# Patient Record
Sex: Female | Born: 1975
Health system: Southern US, Community
[De-identification: ages and names within clinical notes are randomized; demographics above are authoritative.]

## PROBLEM LIST (undated history)

## (undated) DIAGNOSIS — I10 Essential (primary) hypertension: Secondary | ICD-10-CM

## (undated) DIAGNOSIS — B1920 Unspecified viral hepatitis C without hepatic coma: Secondary | ICD-10-CM

## (undated) HISTORY — PX: ABDOMINAL SURGERY: SHX537

## (undated) HISTORY — PX: APPENDECTOMY: SHX54

---

## 2002-08-17 ENCOUNTER — Encounter: Payer: Self-pay | Admitting: Family Medicine

## 2002-08-17 ENCOUNTER — Ambulatory Visit (HOSPITAL_COMMUNITY): Admission: RE | Admit: 2002-08-17 | Discharge: 2002-08-17 | Payer: Self-pay | Admitting: Family Medicine

## 2011-04-03 ENCOUNTER — Emergency Department (HOSPITAL_COMMUNITY): Payer: Self-pay

## 2011-04-03 ENCOUNTER — Emergency Department (HOSPITAL_COMMUNITY)
Admission: EM | Admit: 2011-04-03 | Discharge: 2011-04-03 | Disposition: A | Discharge status: Home / Self Care | Payer: Self-pay | Attending: Emergency Medicine | Admitting: Emergency Medicine

## 2011-04-03 ENCOUNTER — Other Ambulatory Visit: Payer: Self-pay

## 2011-04-03 ENCOUNTER — Encounter: Payer: Self-pay | Admitting: *Deleted

## 2011-04-03 DIAGNOSIS — I10 Essential (primary) hypertension: Secondary | ICD-10-CM | POA: Insufficient documentation

## 2011-04-03 DIAGNOSIS — R0602 Shortness of breath: Secondary | ICD-10-CM | POA: Insufficient documentation

## 2011-04-03 DIAGNOSIS — F17200 Nicotine dependence, unspecified, uncomplicated: Secondary | ICD-10-CM | POA: Insufficient documentation

## 2011-04-03 DIAGNOSIS — K529 Noninfective gastroenteritis and colitis, unspecified: Secondary | ICD-10-CM

## 2011-04-03 DIAGNOSIS — R1011 Right upper quadrant pain: Secondary | ICD-10-CM | POA: Insufficient documentation

## 2011-04-03 DIAGNOSIS — G44209 Tension-type headache, unspecified, not intractable: Secondary | ICD-10-CM

## 2011-04-03 DIAGNOSIS — R42 Dizziness and giddiness: Secondary | ICD-10-CM | POA: Insufficient documentation

## 2011-04-03 DIAGNOSIS — B349 Viral infection, unspecified: Secondary | ICD-10-CM

## 2011-04-03 DIAGNOSIS — R51 Headache: Secondary | ICD-10-CM | POA: Insufficient documentation

## 2011-04-03 DIAGNOSIS — N390 Urinary tract infection, site not specified: Secondary | ICD-10-CM

## 2011-04-03 DIAGNOSIS — R002 Palpitations: Secondary | ICD-10-CM | POA: Insufficient documentation

## 2011-04-03 DIAGNOSIS — E860 Dehydration: Secondary | ICD-10-CM

## 2011-04-03 HISTORY — DX: Essential (primary) hypertension: I10

## 2011-04-03 LAB — COMPREHENSIVE METABOLIC PANEL
ALT: 11 U/L (ref 0–35)
Alkaline Phosphatase: 62 U/L (ref 39–117)
BUN: 10 mg/dL (ref 6–23)
CO2: 27 mEq/L (ref 19–32)
GFR calc Af Amer: >60 mL/min (ref >60)
GFR calc non Af Amer: >60 mL/min (ref >60)
Glucose, Bld: 94 mg/dL (ref 70–99)
Potassium: 4.1 mEq/L (ref 3.5–5.1)
Sodium: 135 mEq/L (ref 135–145)
Total Bilirubin: 0.7 mg/dL (ref 0.3–1.2)
Total Protein: 7.6 g/dL (ref 6.0–8.3)

## 2011-04-03 LAB — CBC
Hemoglobin: 16.4 g/dL — ABNORMAL HIGH (ref 12.0–15.0)
MCH: 31.8 pg (ref 26.0–34.0)
MCV: 93 fL (ref 78.0–100.0)
RBC: 5.15 MIL/uL — ABNORMAL HIGH (ref 3.87–5.11)
WBC: 7.8 10*3/uL (ref 4.0–10.5)

## 2011-04-03 LAB — DIFFERENTIAL
Eosinophils Absolute: 0.1 10*3/uL (ref 0.0–0.7)
Lymphocytes Relative: 26 % (ref 12–46)
Lymphs Abs: 2 10*3/uL (ref 0.7–4.0)
Monocytes Relative: 9 % (ref 3–12)
Neutrophils Relative %: 64 % (ref 43–77)

## 2011-04-03 LAB — URINALYSIS, ROUTINE W REFLEX MICROSCOPIC
Bilirubin Urine: NEGATIVE
Glucose, UA: NEGATIVE mg/dL
Hgb urine dipstick: NEGATIVE
Ketones, ur: 15 mg/dL — AB
Protein, ur: NEGATIVE mg/dL
Urobilinogen, UA: 0.2 mg/dL (ref 0.0–1.0)

## 2011-04-03 LAB — URINE MICROSCOPIC-ADD ON

## 2011-04-03 LAB — LIPASE, BLOOD: Lipase: 29 U/L (ref 11–59)

## 2011-04-03 MED ORDER — KETOROLAC TROMETHAMINE 30 MG/ML IJ SOLN
30.0000 mg | Freq: Once | INTRAMUSCULAR | Status: DC
  Filled 2011-04-03: qty 1

## 2011-04-03 MED ORDER — SODIUM CHLORIDE 0.9 % IV SOLN
INTRAVENOUS | Status: DC
  Administered 2011-04-03: 17:00:00 via INTRAVENOUS

## 2011-04-03 MED ORDER — FAMOTIDINE 20 MG PO TABS: 20.0000 mg | ORAL_TABLET | Freq: Two times a day (BID) | ORAL | Status: DC

## 2011-04-03 MED ORDER — HYDROCODONE-ACETAMINOPHEN 5-325 MG PO TABS: 2.0000 | ORAL_TABLET | ORAL | Status: AC | PRN

## 2011-04-03 MED ORDER — ACETAMINOPHEN 325 MG PO TABS
650.0000 mg | ORAL_TABLET | Freq: Once | ORAL | Status: AC
  Administered 2011-04-03: 650 mg via ORAL
  Filled 2011-04-03: qty 2

## 2011-04-03 MED ORDER — SULFAMETHOXAZOLE-TRIMETHOPRIM 800-160 MG PO TABS: 1.0000 | ORAL_TABLET | Freq: Two times a day (BID) | ORAL | Status: AC

## 2011-04-03 MED ORDER — ONDANSETRON HCL 4 MG/2ML IJ SOLN
4.0000 mg | Freq: Once | INTRAMUSCULAR | Status: AC
  Administered 2011-04-03: 4 mg via INTRAVENOUS
  Filled 2011-04-03: qty 2

## 2011-04-03 MED ORDER — FAMOTIDINE IN NACL 20-0.9 MG/50ML-% IV SOLN
20.0000 mg | Freq: Once | INTRAVENOUS | Status: AC
  Administered 2011-04-03: 20 mg via INTRAVENOUS
  Filled 2011-04-03: qty 50

## 2011-04-03 MED ORDER — HYDROMORPHONE HCL 1 MG/ML IJ SOLN
1.0000 mg | Freq: Once | INTRAMUSCULAR | Status: AC
  Administered 2011-04-03: 1 mg via INTRAVENOUS
  Filled 2011-04-03: qty 1

## 2011-04-03 MED ORDER — SODIUM CHLORIDE 0.9 % IV BOLUS (SEPSIS)
1000.0000 mL | Freq: Once | INTRAVENOUS | Status: AC
  Administered 2011-04-03 (×2): 1000 mL via INTRAVENOUS

## 2011-04-03 MED ORDER — ONDANSETRON HCL 4 MG PO TABS: 8.0000 mg | ORAL_TABLET | Freq: Three times a day (TID) | ORAL | Status: AC | PRN

## 2011-04-03 NOTE — ED Provider Notes (Signed)
History   Scribed for Kylie Bonier, MD, the patient was seen in room APA17/APA17. This chart was scribed by Kylie Reynolds. This patient's care was started at 3:27PM.   CSN: [id number] Arrival date & time: 04/03/2011  2:39 PM  Chief Complaint  Patient presents with     . Dizziness    HPI   HPI Kylie Reynolds is a 35 y.o. female who presents to the Emergency Department complaining of dizziness onset several days ago and persistent since with associated lightheadedness, palpitations, RUQ abdominal discomfort, severe HA, episodes of SOB and dark urine.  Patient reports dizziness is aggravated with movement of head, standing and sitting up and moderately relieved with lying flat. Patient states nausea and vomiting have become worse today noting that she is now unable to eat bland foods or drink water without vomiting. Furthermore, patient reports that she can "hear and feel a pulse in my ear" and describes the sound as a "wooshing". Denies diarrhea, dysuria, hematochezia. Reports last BM occurred this morning but had not had a BM for several days prior to today.    HPI ELEMENTS: Onset: Several days ago Duration: persistent since but  Modifying factors:   aggravated with movement of head, standing and sitting up and moderately relieved with lying flat Context:  as above  Associated symptoms:  lightheadedness, palpitations, RUQ abdominal discomfort, severe HA, episodes of SOB and dark urine. Denies diarrhea, dysuria, hematochezia.   PAST MEDICAL HISTORY:  Past Medical History  Diagnosis Date  . Hypertension     PAST SURGICAL HISTORY:  Past Surgical History  Procedure Date  . Abdominal surgery   . Appendectomy     FAMILY HISTORY:  History reviewed. No pertinent family history.   SOCIAL HISTORY: History   Social History  . Marital Status: Single    Spouse Name: N/A    Number of Children: N/A  . Years of Education: N/A   Social History Main Topics  . Smoking status:  Current Everyday Smoker -- 1.0 packs/day  . Smokeless tobacco: None  . Alcohol Use: No  . Drug Use: No  . Sexually Active:    Other Topics Concern  . None   Social History Narrative  . None      Review of Systems  Review of Systems 10 Systems reviewed and are negative for acute change except as noted in the HPI.  Allergies  Review of patient's allergies indicates no known allergies.  Home Medications   Current Outpatient Rx  Name Route Sig Dispense Refill  . IBUPROFEN 200 MG PO TABS Oral Take 400 mg by mouth every 6 (six) hours as needed. Headache     . LISINOPRIL-HYDROCHLOROTHIAZIDE 20-25 MG PO TABS Oral Take 1 tablet by mouth daily.        Physical Exam    BP 134/86  Pulse 95  Temp(Src) 97.7 F (36.5 C) (Oral)  Resp 20  Ht 5\' 8"  (1.727 m)  Wt 179 lb (81.194 kg)  BMI 27.22 kg/m2  SpO2 100%  LMP 03/20/2011  Physical Exam  Nursing note and vitals reviewed. Constitutional: She is oriented to person, place, and time. She appears well-developed and well-nourished.  HENT:  Head: Normocephalic and atraumatic.  Right Ear: Tympanic membrane normal.  Left Ear: Tympanic membrane normal.  Mouth/Throat: Uvula is midline, oropharynx is clear and moist and mucous membranes are normal.  Eyes: Conjunctivae and EOM are normal. Pupils are equal, round, and reactive to light.       Fundoscopic exam  normal.   Neck: Neck supple.  Cardiovascular: Normal rate, regular rhythm, S1 normal, S2 normal and normal heart sounds.  Exam reveals no gallop and no friction rub.   No murmur heard. Pulmonary/Chest: Effort normal and breath sounds normal. She has no wheezes. She has no rales.       Discomfort at RUQ and Epigastrium.   Abdominal: Soft. Bowel sounds are normal. She exhibits no distension. There is no tenderness.  Musculoskeletal: Normal range of motion. She exhibits no edema.  Neurological: She is alert and oriented to person, place, and time. No sensory deficit.       Dicks  hallpike maneuver causes mild nystagmus.   Skin: Skin is warm and dry.  Psychiatric: She has a normal mood and affect. Her behavior is normal.    ED Course  Procedures  OTHER DATA REVIEWED: Nursing notes, vital signs, and past medical records reviewed. Lab results reviewed and considered Imaging results reviewed and considered  DIAGNOSTIC STUDIES: Oxygen Saturation is 100% on room air, normal by my interpretation.     Date: 04/03/2011  Rate: 83 BPM  Rhythm: normal sinus rhythm  QRS Axis: normal  Intervals: normal  ST/T Wave abnormalities: normal  Conduction Disutrbances:none  Narrative Interpretation:   Old EKG Reviewed: unchanged   LABS / RADIOLOGY: Results for orders placed during the hospital encounter of 04/03/11  CBC      Component Value Range   WBC 7.8  4.0 - 10.5 (K/uL)   RBC 5.15 (*) 3.87 - 5.11 (MIL/uL)   Hemoglobin 16.4 (*) 12.0 - 15.0 (g/dL)   HCT 16.1 (*) 09.6 - 46.0 (%)   MCV 93.0  78.0 - 100.0 (fL)   MCH 31.8  26.0 - 34.0 (pg)   MCHC 34.2  30.0 - 36.0 (g/dL)   RDW 04.5  40.9 - 81.1 (%)   Platelets 204  150 - 400 (K/uL)  DIFFERENTIAL      Component Value Range   Neutrophils Relative 64  43 - 77 (%)   Neutro Abs 5.0  1.7 - 7.7 (K/uL)   Lymphocytes Relative 26  12 - 46 (%)   Lymphs Abs 2.0  0.7 - 4.0 (K/uL)   Monocytes Relative 9  3 - 12 (%)   Monocytes Absolute 0.7  0.1 - 1.0 (K/uL)   Eosinophils Relative 1  0 - 5 (%)   Eosinophils Absolute 0.1  0.0 - 0.7 (K/uL)   Basophils Relative 0  0 - 1 (%)   Basophils Absolute 0.0  0.0 - 0.1 (K/uL)  COMPREHENSIVE METABOLIC PANEL      Component Value Range   Sodium 135  135 - 145 (mEq/L)   Potassium 4.1  3.5 - 5.1 (mEq/L)   Chloride 96  96 - 112 (mEq/L)   CO2 27  19 - 32 (mEq/L)   Glucose, Bld 94  70 - 99 (mg/dL)   BUN 10  6 - 23 (mg/dL)   Creatinine, Ser 9.14  0.50 - 1.10 (mg/dL)   Calcium 78.2  8.4 - 10.5 (mg/dL)   Total Protein 7.6  6.0 - 8.3 (g/dL)   Albumin 4.2  3.5 - 5.2 (g/dL)   AST 15  0 - 37  (U/L)   ALT 11  0 - 35 (U/L)   Alkaline Phosphatase 62  39 - 117 (U/L)   Total Bilirubin 0.7  0.3 - 1.2 (mg/dL)   GFR calc non Af Amer >60  >60 (mL/min)   GFR calc Af Amer >60  >60 (mL/min)  LIPASE, BLOOD      Component Value Range   Lipase 29  11 - 59 (U/L)  TROPONIN I      Component Value Range   Troponin I <0.30  <0.30 (ng/mL)  URINALYSIS, ROUTINE W REFLEX MICROSCOPIC      Component Value Range   Color, Urine YELLOW  YELLOW    Appearance HAZY (*) CLEAR    Specific Gravity, Urine 1.020  1.005 - 1.030    pH 7.0  5.0 - 8.0    Glucose, UA NEGATIVE  NEGATIVE (mg/dL)   Hgb urine dipstick NEGATIVE  NEGATIVE    Bilirubin Urine NEGATIVE  NEGATIVE    Ketones, ur 15 (*) NEGATIVE (mg/dL)   Protein, ur NEGATIVE  NEGATIVE (mg/dL)   Urobilinogen, UA 0.2  0.0 - 1.0 (mg/dL)   Nitrite NEGATIVE  NEGATIVE    Leukocytes, UA MODERATE (*) NEGATIVE   URINE MICROSCOPIC-ADD ON      Component Value Range   Squamous Epithelial / LPF FEW (*) RARE    WBC, UA 7-10  <3 (WBC/hpf)   RBC / HPF 3-6  <3 (RBC/hpf)   Bacteria, UA FEW (*) RARE    Urine-Other MUCOUS PRESENT     Ct Head Wo Contrast  04/03/2011  *RADIOLOGY REPORT*  Clinical Data: Left sided headache.  CT HEAD WITHOUT CONTRAST  Technique:  Contiguous axial images were obtained from the base of the skull through the vertex without contrast.  Comparison: None.  Findings: No acute intracranial abnormality.  Specifically, no hemorrhage, hydrocephalus, mass lesion, acute infarction, or significant intracranial injury.  No acute calvarial abnormality. Visualized paranasal sinuses and mastoids clear.  Orbital soft tissues unremarkable.  IMPRESSION: No acute intracranial abnormality.  Original Report Authenticated By: Cyndie Chime, M.D.   US Abdomen Complete  04/03/2011  *RADIOLOGY REPORT*  Clinical Data:  Right upper quadrant pain  ABDOMINAL ULTRASOUND COMPLETE  Comparison:  None.  Findings:  Gallbladder:  Increased gallbladder wall thickness measuring 3.2  mm.  No stones or sludge.  Negative sonographic Murphy's sign.  Common Bile Duct:  Mild increased caliber of the common bile duct which measures 7.6 mm.  Liver: No focal mass lesion identified.  Within normal limits in parenchymal echogenicity.  IVC:  Appears normal.  Pancreas:  No abnormality identified.  Spleen:  Within normal limits in size and echotexture.  Right kidney:  Normal in size and parenchymal echogenicity.  No evidence of mass or hydronephrosis.  Left kidney:  Normal in size and parenchymal echogenicity.  No evidence of mass or hydronephrosis.  Abdominal Aorta:  No aneurysm identified.  IMPRESSION:  1.  Gallbladder wall is mildly thickened measuring 3.2 mm.  No additional secondary signs of acute cholecystitis identified.  Original Report Authenticated By: Rosealee Albee, M.D.    PROCEDURES:  ED COURSE / COORDINATION OF CARE: Orders Placed This Encounter  Procedures  . Urine culture  . US Abdomen Complete  . CT Head Wo Contrast  . CBC  . Differential  . Comprehensive metabolic panel  . Lipase, blood  . Troponin I  . Urinalysis with microscopic  . Urine microscopic-add on  . Orthostatic vital signs  . EKG test  4:40PM- Received phone call from Augusto Gamble in radiology regarding the CTA- Head that I ordered looking for aneurysm. He said that a CTA would give less diagnostic ield regarding a a ruptured aneurysm which I do not suspect based on history of present illness. The patient's Gaylyn Rong was gradual in onset, persistent with mild  to moderate intensity. He argued that a CTA-Head based on presentation was not protocol and that CT-Head without contrast was advised for initial workup. I do at this time agree with his recommendation and have changed the order to a CT-Head without contrast.   I have reviewed the patient's CT scan which shows mild gallbladder wall thickening, however there was no sonographic or clinical Murphy's sign on my reevaluation. The patient is now pain-free and  reports no nausea, states she is hungry and wishes to go home so that she can E. My impression is that this is a viral syndrome and not cholecystitis. I have reviewed the findings with the patient and she states her understanding of and agreement with the plan of discharge home for symptomatic treatment. MDM: Differential Diagnosis: Viral syndrome, gastroenteritis, benign peripheral vertigo, cerebral aneurysm, tension HA, orthostatic hypotension, cholecystitis, cholelithiasis, arrhythmia, anemia, electrolyte imbalance.    PLAN:  The patient is to return the emergency department if there is any worsening of symptoms. I have reviewed the discharge instructions with the patient/family  CONDITION ON DISCHARGE:   DIAGNOSIS: No diagnosis found.   MEDICATIONS GIVEN IN THE E.D.  Medications  lisinopril-hydrochlorothiazide (PRINZIDE,ZESTORETIC) 20-25 MG per tablet (not administered)  ibuprofen (ADVIL,MOTRIN) 200 MG tablet (not administered)  0.9 %  sodium chloride infusion (  Intravenous New Bag 04/03/11 1640)  famotidine (PEPCID) IVPB 20 mg (20 mg Intravenous Given 04/03/11 1819)  ketorolac (TORADOL) 30 MG/ML injection 30 mg (not administered)  sodium chloride 0.9 % bolus 1,000 mL (1000 mL Intravenous Given 04/03/11 1717)  acetaminophen (TYLENOL) tablet 650 mg (650 mg Oral Given 04/03/11 1714)  ondansetron (ZOFRAN) injection 4 mg (4 mg Intravenous Given 04/03/11 1818)  HYDROmorphone (DILAUDID) injection 1 mg (1 mg Intravenous Given 04/03/11 1819)      I personally performed the services described in this documentation, which was scribed in my presence. The recorded information has been reviewed and considered.  Kylie Bonier, MD 04/03/11 2046

## 2011-04-03 NOTE — ED Notes (Signed)
Pt c/o vomiting weakness and dizziness. Pt states she has been having palpations and gets lightheaded when she stands.

## 2011-04-05 LAB — URINE CULTURE
Colony Count: 8000
Culture  Setup Time: 201209220136

## 2012-08-23 ENCOUNTER — Emergency Department (HOSPITAL_COMMUNITY)
Admission: EM | Admit: 2012-08-23 | Discharge: 2012-08-24 | Disposition: A | Discharge status: Home / Self Care | Payer: Self-pay | Attending: Emergency Medicine | Admitting: Emergency Medicine

## 2012-08-23 ENCOUNTER — Emergency Department (HOSPITAL_COMMUNITY): Payer: Self-pay

## 2012-08-23 ENCOUNTER — Encounter (HOSPITAL_COMMUNITY): Payer: Self-pay | Admitting: Physical Medicine and Rehabilitation

## 2012-08-23 DIAGNOSIS — I10 Essential (primary) hypertension: Secondary | ICD-10-CM | POA: Insufficient documentation

## 2012-08-23 DIAGNOSIS — R110 Nausea: Secondary | ICD-10-CM | POA: Insufficient documentation

## 2012-08-23 DIAGNOSIS — F17200 Nicotine dependence, unspecified, uncomplicated: Secondary | ICD-10-CM | POA: Insufficient documentation

## 2012-08-23 DIAGNOSIS — K759 Inflammatory liver disease, unspecified: Secondary | ICD-10-CM

## 2012-08-23 DIAGNOSIS — R350 Frequency of micturition: Secondary | ICD-10-CM | POA: Insufficient documentation

## 2012-08-23 DIAGNOSIS — F1910 Other psychoactive substance abuse, uncomplicated: Secondary | ICD-10-CM | POA: Insufficient documentation

## 2012-08-23 DIAGNOSIS — M549 Dorsalgia, unspecified: Secondary | ICD-10-CM | POA: Insufficient documentation

## 2012-08-23 LAB — CBC WITH DIFFERENTIAL/PLATELET
Basophils Relative: 1 % (ref 0–1)
Eosinophils Absolute: 0 10*3/uL (ref 0.0–0.7)
Hemoglobin: 15.5 g/dL — ABNORMAL HIGH (ref 12.0–15.0)
Lymphs Abs: 1.1 10*3/uL (ref 0.7–4.0)
MCH: 30.5 pg (ref 26.0–34.0)
Neutro Abs: 2.9 10*3/uL (ref 1.7–7.7)
Neutrophils Relative %: 62 % (ref 43–77)
Platelets: 282 10*3/uL (ref 150–400)
RBC: 5.08 MIL/uL (ref 3.87–5.11)

## 2012-08-23 LAB — RAPID URINE DRUG SCREEN, HOSP PERFORMED
Amphetamines: NOT DETECTED
Barbiturates: NOT DETECTED
Opiates: NOT DETECTED
Tetrahydrocannabinol: NOT DETECTED

## 2012-08-23 LAB — COMPREHENSIVE METABOLIC PANEL
ALT: 165 U/L — ABNORMAL HIGH (ref 0–35)
AST: 325 U/L — ABNORMAL HIGH (ref 0–37)
Alkaline Phosphatase: 134 U/L — ABNORMAL HIGH (ref 39–117)
CO2: 28 mEq/L (ref 19–32)
Calcium: 8.7 mg/dL (ref 8.4–10.5)
Potassium: 3.9 mEq/L (ref 3.5–5.1)
Sodium: 144 mEq/L (ref 135–145)
Total Protein: 7.2 g/dL (ref 6.0–8.3)

## 2012-08-23 LAB — ETHANOL: Alcohol, Ethyl (B): <11 mg/dL (ref 0–11)

## 2012-08-23 MED ORDER — IBUPROFEN 200 MG PO TABS
600.0000 mg | ORAL_TABLET | Freq: Three times a day (TID) | ORAL | Status: DC | PRN
  Administered 2012-08-23: 600 mg via ORAL
  Filled 2012-08-23: qty 3

## 2012-08-23 MED ORDER — LORAZEPAM 1 MG PO TABS
1.0000 mg | ORAL_TABLET | Freq: Three times a day (TID) | ORAL | Status: DC | PRN
  Administered 2012-08-24: 1 mg via ORAL
  Filled 2012-08-23: qty 1

## 2012-08-23 NOTE — BH Assessment (Signed)
Assessment Note   Patient is a 37 year old white female requesting detox from prescription pills.   Patient reports that she has been abusing pills for the past 2 years.  Patient has a BAL of <11 and her UDS were positive for cocaine.  Patient reports that her last use of cocaine and prescription medication was yesterday.  Patient reports that she lost her job due to substance abuse.  Patient reports that the amount of drugs and alcohol she uses daily varies.  Patient reports that she has been experiencing the following withdrawal symptoms to include sweating, chills, agitation, aggressiveness, cramps, aches, tingling.    Patient denies any prior psychiatric hospitalization or mental health treatment.  Patient denies any past substance abuse treatment.  Patient reports that due to a prior UTI infection last week [facility] would not allow her to come without medical clearance.   Patient denies SI/HI.  Patient denies psychosis.    Axis I: Substance Abuse Opiate Dependence  Axis II: Deferred Axis III:  Past Medical History  Diagnosis Date  . Hypertension    Axis IV: economic problems, housing problems, occupational problems, problems related to social environment and problems with access to health care services Axis V: 41-50 serious symptoms  Past Medical History:  Past Medical History  Diagnosis Date  . Hypertension     Past Surgical History  Procedure Laterality Date  . Abdominal surgery    . Appendectomy      Family History: No family history on file.  Social History:  reports that she has been smoking.  She does not have any smokeless tobacco history on file. She reports that she uses illicit drugs (Cocaine). She reports that she does not drink alcohol.  Additional Social History:     CIWA: CIWA-Ar BP: 123/77 mmHg Pulse Rate: 79 COWS:    Allergies: No Known Allergies  Home Medications:  (Not in a hospital admission)  OB/GYN Status:  Patient's last menstrual period was  08/09/2012.  General Assessment Data Location of Assessment: [facility] ED ACT Assessment: Yes Living Arrangements: Other relatives Can pt return to current living arrangement?: Yes Admission Status: Voluntary Is patient capable of signing voluntary admission?: Yes Transfer from: Acute Hospital Referral Source: Self/Family/Friend  Education Status Is patient currently in school?: No  Risk to self Suicidal Ideation: No Suicidal Intent: No Is patient at risk for suicide?: No Suicidal Plan?: No Access to Means: No What has been your use of drugs/alcohol within the last 12 months?: none Previous Attempts/Gestures: No How many times?: 0 Other Self Harm Risks: none  Triggers for Past Attempts: None known Intentional Self Injurious Behavior: None Family Suicide History: No Recent stressful life event(s): Other (Comment) Persecutory voices/beliefs?: No Depression: Yes Depression Symptoms: Insomnia;Tearfulness;Fatigue;Loss of interest in usual pleasures Substance abuse history and/or treatment for substance abuse?: Yes Suicide prevention information given to non-admitted patients: Not applicable  Risk to Others Homicidal Ideation: No Thoughts of Harm to Others: No Current Homicidal Intent: No Current Homicidal Plan: No Access to Homicidal Means: No Identified Victim: none History of harm to others?: No Assessment of Violence: None Noted Violent Behavior Description: none Does patient have access to weapons?: No Criminal Charges Pending?: No Does patient have a court date: No  Psychosis Hallucinations: None noted Delusions: None noted  Mental Status Report Appear/Hygiene: Disheveled Eye Contact: Fair Motor Activity: Freedom of movement Speech: Slow Level of Consciousness: Alert Mood: Depressed Affect: Depressed Anxiety Level: Moderate Thought Processes: Coherent Judgement: Unimpaired Orientation: Person;Place;Time;Situation Obsessive Compulsive Thoughts/Behaviors:  None  Cognitive Functioning Concentration: Decreased Memory: Recent Intact;Remote Intact IQ: Average Insight: Fair Impulse Control: Poor Appetite: Poor Weight Loss: 0 Weight Gain: 0 Sleep: Decreased Total Hours of Sleep: 3 Vegetative Symptoms: None  ADLScreening [facility] Assessment Services) Patient's cognitive ability adequate to safely complete daily activities?: Yes Patient able to express need for assistance with ADLs?: Yes Independently performs ADLs?: Yes (appropriate for developmental age)  Abuse/Neglect [facility]) Physical Abuse: Denies Verbal Abuse: Denies Sexual Abuse: Denies  Prior Inpatient Therapy Prior Inpatient Therapy: No Prior Therapy Dates: na Prior Therapy Facilty/Provider(s): na Reason for Treatment: na  Prior Outpatient Therapy Prior Outpatient Therapy: No Prior Therapy Dates: na Prior Therapy Facilty/Provider(s): na Reason for Treatment: na  ADL Screening (condition at time of admission) Patient's cognitive ability adequate to safely complete daily activities?: Yes Patient able to express need for assistance with ADLs?: Yes Independently performs ADLs?: Yes (appropriate for developmental age)       Abuse/Neglect Assessment (Assessment to be complete while patient is alone) Physical Abuse: Denies Verbal Abuse: Denies Sexual Abuse: Denies Values / Beliefs Cultural Requests During Hospitalization: None Spiritual Requests During Hospitalization: None        Additional Information 1:1 In Past 12 Months?: No CIRT Risk: No Elopement Risk: No Does patient have medical clearance?: Yes     Disposition: Pending [facility]  Disposition Disposition of Patient: Referred to Patient referred to: [facility]  On Site Evaluation by:   Reviewed with Physician:     Phillip Heal LaVerne 08/23/2012 11:25 PM

## 2012-08-23 NOTE — BH Assessment (Signed)
Assessment Note   Patient is a 37 year old white female requesting detox from prescription pills.   Patient reports that she has been abusing pills for the past 2 years.  Patient has a BAL of <11 and her UDS were positive for cocaine.  Patient reports that her last use of cocaine and prescription medication was yesterday.  Patient reports that she lost her job due to substance abuse.  Patient reports that the amount of drugs and alcohol she uses daily varies.  Patient reports that she has been experiencing the following withdrawal symptoms to include sweating, chills, agitation, aggressiveness, cramps, aches, tingling.    Patient denies any prior psychiatric hospitalization or mental health treatment.  Patient denies any past substance abuse treatment.  Patient reports that due to a prior UTI infection last week [facility] would not allow her to come without medical clearance.   Patient denies SI/HI.  Patient denies psychosis.    Axis I: Opiate Dependence Axis II: Deferred Axis III:  Past Medical History  Diagnosis Date  . Hypertension    Axis IV: economic problems, occupational problems, problems related to legal system/crime, problems related to social environment and problems with primary support group Axis V: 41-50 serious symptoms  Past Medical History:  Past Medical History  Diagnosis Date  . Hypertension     Past Surgical History  Procedure Laterality Date  . Abdominal surgery    . Appendectomy      Family History: No family history on file.  Social History:  reports that she has been smoking.  She does not have any smokeless tobacco history on file. She reports that she uses illicit drugs (Cocaine). She reports that she does not drink alcohol.  Additional Social History:     CIWA: CIWA-Ar BP: 123/77 mmHg Pulse Rate: 79 COWS:    Allergies: No Known Allergies  Home Medications:  (Not in a hospital admission)  OB/GYN Status:  Patient's last menstrual period was  08/09/2012.  General Assessment Data Location of Assessment: [facility] ED ACT Assessment: Yes Living Arrangements: Other relatives Can pt return to current living arrangement?: Yes Admission Status: Voluntary Is patient capable of signing voluntary admission?: Yes Transfer from: Acute Hospital Referral Source: Self/Family/Friend  Education Status Is patient currently in school?: No  Risk to self Suicidal Ideation: No Suicidal Intent: No Is patient at risk for suicide?: No Suicidal Plan?: No Access to Means: No What has been your use of drugs/alcohol within the last 12 months?: none Previous Attempts/Gestures: No How many times?: 0 Other Self Harm Risks: none  Triggers for Past Attempts: None known Intentional Self Injurious Behavior: None Family Suicide History: No Recent stressful life event(s): Other (Comment) Persecutory voices/beliefs?: No Depression: Yes Depression Symptoms: Insomnia;Tearfulness;Fatigue;Loss of interest in usual pleasures Substance abuse history and/or treatment for substance abuse?: Yes Suicide prevention information given to non-admitted patients: Not applicable  Risk to Others Homicidal Ideation: No Thoughts of Harm to Others: No Current Homicidal Intent: No Current Homicidal Plan: No Access to Homicidal Means: No Identified Victim: none History of harm to others?: No Assessment of Violence: None Noted Violent Behavior Description: none Does patient have access to weapons?: No Criminal Charges Pending?: No Does patient have a court date: No  Psychosis Hallucinations: None noted Delusions: None noted  Mental Status Report Appear/Hygiene: Disheveled Eye Contact: Fair Motor Activity: Freedom of movement Speech: Slow Level of Consciousness: Alert Mood: Depressed Affect: Depressed Anxiety Level: Moderate Thought Processes: Coherent Judgement: Unimpaired Orientation: Person;Place;Time;Situation Obsessive Compulsive Thoughts/Behaviors:  None  Cognitive Functioning Concentration: Decreased Memory: Recent Intact;Remote Intact IQ: Average Insight: Fair Impulse Control: Poor Appetite: Poor Weight Loss: 0 Weight Gain: 0 Sleep: Decreased Total Hours of Sleep: 3 Vegetative Symptoms: None  ADLScreening [facility] Assessment Services) Patient's cognitive ability adequate to safely complete daily activities?: Yes Patient able to express need for assistance with ADLs?: Yes Independently performs ADLs?: Yes (appropriate for developmental age)  Abuse/Neglect [facility]) Physical Abuse: Denies Verbal Abuse: Denies Sexual Abuse: Denies  Prior Inpatient Therapy Prior Inpatient Therapy: No Prior Therapy Dates: na Prior Therapy Facilty/Provider(s): na Reason for Treatment: na  Prior Outpatient Therapy Prior Outpatient Therapy: No Prior Therapy Dates: na Prior Therapy Facilty/Provider(s): na Reason for Treatment: na  ADL Screening (condition at time of admission) Patient's cognitive ability adequate to safely complete daily activities?: Yes Patient able to express need for assistance with ADLs?: Yes Independently performs ADLs?: Yes (appropriate for developmental age)       Abuse/Neglect Assessment (Assessment to be complete while patient is alone) Physical Abuse: Denies Verbal Abuse: Denies Sexual Abuse: Denies Values / Beliefs Cultural Requests During Hospitalization: None Spiritual Requests During Hospitalization: None        Additional Information 1:1 In Past 12 Months?: No CIRT Risk: No Elopement Risk: No Does patient have medical clearance?: Yes     Disposition: Pending [facility].  Disposition Disposition of Patient: Referred to Patient referred to: [facility]  On Site Evaluation by:   Reviewed with Physician:     Phillip Heal LaVerne 08/23/2012 11:21 PM

## 2012-08-23 NOTE — ED Provider Notes (Signed)
History  This chart was scribed for Felicie Morn, non-physician practitioner working with Gilda Crease,  by Bennett Scrape, ED Scribe. This patient was seen in room TR11C/TR11C and the patient's care was started at 5:24 PM.  CSN: [id number]  Arrival date & time 08/23/12  1644   First MD Initiated Contact with Patient 08/23/12 1724      Chief Complaint  Patient presents with  . Medical Clearance     The history is provided by the patient. No language interpreter was used.    Kylie Reynolds is a 37 y.o. female who presents to the Emergency Department for medical clearnace for antibiotics for PNA and UTI. She states that she was diagnosed with PNA through CXR and an UTI last week at [facility] but failed to pick up her prescriptions. She denies any repeat radiology or lab work since then. She states that she has a bed at [facility] for prescription pain medication, crack cocaine and heroine abuse but is unable to be accepted due to the untreated issues. She states that her last use of both was yesterday. She reports urinary frequency, back pain and nausea but denies tremors, emesis, hematuria, CP and abdominal pain currently. She denies HI and SI. She has a h/o HTN and is a current everyday smoker but denies alcohol use.  Past Medical History  Diagnosis Date  . Hypertension     Past Surgical History  Procedure Laterality Date  . Abdominal surgery    . Appendectomy      No family history on file.  History  Substance Use Topics  . Smoking status: Current Every Day Smoker -- 1.00 packs/day  . Smokeless tobacco: Not on file  . Alcohol Use: No    No OB history provided.  Review of Systems  Cardiovascular: Negative for chest pain.  Gastrointestinal: Positive for nausea. Negative for vomiting and abdominal pain.  Genitourinary: Positive for frequency. Negative for hematuria.  Musculoskeletal: Positive for back pain. Negative for gait problem.  Neurological:  Negative for tremors.  All other systems reviewed and are negative.    Allergies  Review of patient's allergies indicates no known allergies.  Home Medications  No current outpatient prescriptions on file.  Triage Vitals: BP 145/91  Pulse 108  Temp(Src) 97.9 F (36.6 C) (Oral)  Resp 18  SpO2 99%  Physical Exam  Nursing note and vitals reviewed. Constitutional: She is oriented to person, place, and time. She appears well-developed and well-nourished. No distress.  HENT:  Head: Normocephalic and atraumatic.  Eyes: Conjunctivae and EOM are normal.  Neck: Normal range of motion. Neck supple. No tracheal deviation present.  Cardiovascular: Normal rate, regular rhythm and intact distal pulses.   Pulmonary/Chest: Effort normal and breath sounds normal. No respiratory distress.  Abdominal: Soft. There is no tenderness.  Musculoskeletal: Normal range of motion.  Right paraspinal lumbar tenderness  Neurological: She is alert and oriented to person, place, and time.  Skin: Skin is warm and dry.  Psychiatric: She has a normal mood and affect. Her behavior is normal.    ED Course  Procedures (including critical care time)  DIAGNOSTIC STUDIES: Oxygen Saturation is 99% on room air, normal by my interpretation.    COORDINATION OF CARE: 6:12 PM-Discussed treatment plan which includes CXR, CBC panel, UA, drug screen and ethanol screen with pt at bedside and pt agreed to plan.   6:30 PM- Discussed case with ACT who agrees to evaluate the pt in the ED.  Labs Reviewed  URINE RAPID DRUG SCREEN (HOSP PERFORMED) - Abnormal; Notable for the following:    Cocaine POSITIVE (*)    All other components within normal limits  CBC WITH DIFFERENTIAL - Abnormal; Notable for the following:    Hemoglobin 15.5 (*)    All other components within normal limits  COMPREHENSIVE METABOLIC PANEL - Abnormal; Notable for the following:    Glucose, Bld 101 (*)    AST 325 (*)    ALT 165 (*)    Alkaline  Phosphatase 134 (*)    All other components within normal limits  ETHANOL  ACETAMINOPHEN LEVEL  URINALYSIS, ROUTINE W REFLEX MICROSCOPIC  HEPATITIS PANEL, ACUTE   No results found.   No diagnosis found.  Patient moved to pod C awaiting ACT team evaluation.  Holding orders initiated.  MDM     I personally performed the services described in this documentation, which was scribed in my presence. The recorded information has been reviewed and is accurate.       Jimmye Norman, NP 08/24/12 [phone]

## 2012-08-23 NOTE — ED Notes (Signed)
Sabino Snipes (sister) Cell [phone] requests to be notified if pt gets placement.

## 2012-08-23 NOTE — ED Notes (Signed)
Pt presents to department for evaluation of detox from pain medications, heroin and crack cocaine. Last use Monday afternoon. Denies SI/HI. States she was seen at [facility] for same, but was unable to be accepted to [facility] due to untreated UTI and pneumonia. Denies pain at the time. Pt is calm and cooperative.

## 2012-08-23 NOTE — ED Notes (Signed)
Pt transported to Xray; transporter given ticket to ride

## 2012-08-24 LAB — URINALYSIS, ROUTINE W REFLEX MICROSCOPIC
Glucose, UA: NEGATIVE mg/dL
Hgb urine dipstick: NEGATIVE
Specific Gravity, Urine: 1.009 (ref 1.005–1.030)
pH: 6 (ref 5.0–8.0)

## 2012-08-24 MED ORDER — CLONIDINE HCL 0.1 MG PO TABS
0.1000 mg | ORAL_TABLET | Freq: Once | ORAL | Status: AC
  Administered 2012-08-24: 0.1 mg via ORAL
  Filled 2012-08-24: qty 1

## 2012-08-24 NOTE — ED Notes (Signed)
Kylie Reynolds from [facility] called stating that she received the paperwork and that she was working on placement.

## 2012-08-24 NOTE — ED Notes (Signed)
Per Berna Spare, pt has been accepted by [facility].  They would like for her to be discharged from here about 0930 to then arrive at [facility] at 1000.

## 2012-08-24 NOTE — ED Notes (Signed)
Pt on the phone with Berna Spare from ACT team.

## 2012-08-24 NOTE — BH Assessment (Signed)
[facility] Assessment Progress Note   This clinician spoke to Pat at [facility] at 04:10.  She said that patient had been accepted to their facility but that she needed to arrive around 10:00 on 02/12.  Patient had told clinician that she could be transported by her sister.  Patient is to call sister about transportation.  Nurse Everardo Pacific is going to talk to patient about being accepted and contacting sister for transportation.  Dr. Dierdre Highman was notified of patient being accepted for detox to [facility].

## 2012-08-24 NOTE — ED Provider Notes (Signed)
Pt seen/examined She is in no distress, but does report feeling some anxiety and SOB EKG reviewed She is in no distress, lung sounds clear She has some paraspinal back pain that is reproducible but no signs of trauma (no midline tenderness, and no focal motor deficits in her extremities) She has no abdominal tenderness - her LFTs are noted but unlikely acute biliary pathology, will need evaluation for this in the future     Date: 08/24/2012  Rate: 84  Rhythm: normal sinus rhythm  QRS Axis: right  Intervals: normal  ST/T Wave abnormalities: normal  Conduction Disutrbances:none  Narrative Interpretation:   Old EKG Reviewed: none available at time of interpretation    Joya Gaskins, MD 08/24/12 [phone]

## 2012-08-24 NOTE — ED Notes (Signed)
Resting awaiting [facility] placement

## 2012-08-24 NOTE — ED Provider Notes (Signed)
Medical screening examination/treatment/procedure(s) were performed by non-physician practitioner and as supervising physician I was immediately available for consultation/collaboration.  Ilona Colley J. Jaydon Avina, MD 08/24/12 2300 

## 2012-08-24 NOTE — ED Notes (Signed)
Pt belongings returned to her. Faxed current mar to [facility]. Also faxed copy of ecg

## 2012-08-24 NOTE — ED Notes (Signed)
Informed pt of her acceptance at [facility].  Pt states that she will call her sister later in the morning.

## 2012-08-24 NOTE — ED Notes (Addendum)
Patient has been evaluated by dr Bebe Shaggy. Pt medicated for symptoms of withdrawal. Pt hyperventilating and states she doesn't feel well. Emotional support given. Pt encouraged that meds given will help. Pt states she has not slept but is so tired. Dr Bebe Shaggy did review pt labs and physically assess pt.

## 2012-08-24 NOTE — ED Notes (Addendum)
Called [facility]. [phone] to verify it is ok for pt to come a little early. Spoke to [employer]. Report given cathy

## 2012-08-26 LAB — HEPATITIS PANEL, ACUTE
HCV Ab: NEGATIVE
Hep A IgM: NEGATIVE
Hep B C IgM: NEGATIVE
Hepatitis B Surface Ag: NEGATIVE

## 2018-07-04 ENCOUNTER — Emergency Department (HOSPITAL_BASED_OUTPATIENT_CLINIC_OR_DEPARTMENT_OTHER)
Admission: EM | Admit: 2018-07-04 | Discharge: 2018-07-04 | Disposition: A | Discharge status: Home / Self Care | Payer: Managed Care, Other (non HMO) | Attending: Emergency Medicine | Admitting: Emergency Medicine

## 2018-07-04 ENCOUNTER — Encounter (HOSPITAL_BASED_OUTPATIENT_CLINIC_OR_DEPARTMENT_OTHER): Payer: Self-pay

## 2018-07-04 ENCOUNTER — Other Ambulatory Visit: Payer: Self-pay

## 2018-07-04 DIAGNOSIS — F17200 Nicotine dependence, unspecified, uncomplicated: Secondary | ICD-10-CM | POA: Diagnosis not present

## 2018-07-04 DIAGNOSIS — I10 Essential (primary) hypertension: Secondary | ICD-10-CM | POA: Diagnosis not present

## 2018-07-04 DIAGNOSIS — M545 Low back pain: Secondary | ICD-10-CM | POA: Diagnosis present

## 2018-07-04 DIAGNOSIS — R202 Paresthesia of skin: Secondary | ICD-10-CM | POA: Diagnosis not present

## 2018-07-04 DIAGNOSIS — G8929 Other chronic pain: Secondary | ICD-10-CM | POA: Diagnosis not present

## 2018-07-04 DIAGNOSIS — M5441 Lumbago with sciatica, right side: Secondary | ICD-10-CM | POA: Diagnosis not present

## 2018-07-04 HISTORY — DX: Unspecified viral hepatitis C without hepatic coma: B19.20

## 2018-07-04 MED ORDER — KETOROLAC TROMETHAMINE 30 MG/ML IJ SOLN
30.0000 mg | Freq: Once | INTRAMUSCULAR | Status: AC
  Administered 2018-07-04: 30 mg via INTRAMUSCULAR
  Filled 2018-07-04: qty 1

## 2018-07-04 MED ORDER — NAPROXEN 500 MG PO TABS: 500.0000 mg | ORAL_TABLET | Freq: Two times a day (BID) | ORAL | 0 refills | Status: AC

## 2018-07-04 MED ORDER — METHOCARBAMOL 500 MG PO TABS: 500.0000 mg | ORAL_TABLET | Freq: Two times a day (BID) | ORAL | 0 refills | Status: DC

## 2018-07-04 MED FILL — NAPROXEN 500 MG TABLET: 500 | 15 days supply | Qty: 30 | Fill #0

## 2018-07-04 MED FILL — METHOCARBAMOL 500 MG TABLET: 500 | 10 days supply | Qty: 20 | Fill #0

## 2018-07-04 NOTE — ED Provider Notes (Signed)
[facility] EMERGENCY DEPARTMENT Provider Note   CSN: [id number] Arrival date & time: 07/04/18  1438     History   Chief Complaint Chief Complaint  Patient presents with  . Back Pain    HPI Kylie Reynolds is a 42 y.o. female presents with constant right sided back pain radiating down right leg onset few years ago. Patient states symptoms have been worse over the last week. Patient describes symptoms as a sharp pain. Patient states pain is worse with movement and better with rest.  Patient states she had a fall a few weeks ago on her left side of her back. Patient states she was sore for a few days and symptoms resolved. Patient denies any other injuries. Patient reports lifting heavy objects at work and constantly bending her back. Patient states she has tried aleve, ice, and head with minimal relief. Patient reports paresthesias on right leg, but states this is not new and she has had these symptoms for years. 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 Denies numbness, weakness, incontinence to bowel/bladder, fever, chills, or hx of cancer.    HPI  Past Medical History:  Diagnosis Date  . Hepatitis C   . Hypertension     There are no active problems to display for this patient.   Past Surgical History:  Procedure Laterality Date  . ABDOMINAL SURGERY    . APPENDECTOMY       OB History   No obstetric history on file.      Home Medications    Prior to Admission medications   Medication Sig Start Date End Date Taking? Authorizing Provider  methocarbamol (ROBAXIN) 500 MG tablet Take 1 tablet (500 mg total) by mouth 2 (two) times daily. 07/04/18   Carlyle BasquesHernandez, Altha Sweitzer P, PA-C  naproxen (NAPROSYN) 500 MG tablet Take 1 tablet (500 mg total) by mouth 2 (two) times daily. 07/04/18   Leretha DykesHernandez, Lakeyshia Tuckerman P, PA-C    Family History No family history on file.  Social History Social History   Tobacco Use  . Smoking status: Current Every Day  Smoker    Packs/day: 1.00  . Smokeless tobacco: Never Used  Substance Use Topics  . Alcohol use: No  . Drug use: Not Currently    Types: Cocaine    Comment: heroin and oxycodone, sober 08/2015     Allergies   Patient has no known allergies.   Review of Systems Review of Systems  Constitutional: Negative for activity change, chills, diaphoresis, fever and unexpected weight change.  Respiratory: Negative for cough and shortness of breath.   Cardiovascular: Negative for chest pain, palpitations and leg swelling.  Gastrointestinal: Negative for abdominal pain, constipation, diarrhea, nausea and vomiting.  Genitourinary: Negative for difficulty urinating, dysuria, flank pain and hematuria.  Musculoskeletal: Positive for back pain. Negative for arthralgias, gait problem, joint swelling, myalgias, neck pain and neck stiffness.  Skin: Negative for rash.  Neurological: Negative for dizziness, syncope, weakness and numbness.       Pt reports paresthesias.   Hematological: Does not bruise/bleed easily.     Physical Exam Updated Vital Signs BP (!) 154/91 (BP Location: Left Arm)   Pulse (!) 104   Temp 98.3 F (36.8 C)   Resp 18   Ht 5\' 7"  (1.702 m)   Wt 90.7 kg   LMP 06/30/2018   SpO2 96%   BMI 31.32 kg/m   Physical Exam Physical Exam  Constitutional: Pt appears well-developed and  well-nourished. No distress.  HENT:  Head: Normocephalic and atraumatic.  Eyes: Conjunctivae are normal.  Neck: Normal range of motion. Neck supple. Full ROM without pain. No cervical midline or paraspinal tenderness noted. Cardiovascular: Normal rate, regular rhythm and intact distal pulses.   Pulmonary/Chest: Effort normal and breath sounds normal. No respiratory distress. Pt has no wheezes.  Abdominal: Soft. Pt exhibits no distension. There is no tenderness.  Musculoskeletal:  Limited ROM of the T-spine, and L-spine with flexion and extension. No tenderness to palpation of the spinous processes  of the T-spine or L-spine. Tenderness to palpation of the paraspinous muscles of the right side of the L-spine. Negative straight leg test.  Neurological: Pt is alert. Speech is clear and goal oriented, follows commands. Pt has normal reflexes. Reflex Scores:      Patellar reflexes are 2+ on the right side and 2+ on the left side.      Achilles reflexes are 2+ on the right side and 2+ on the left side. Normal 5/5 strength in upper and lower extremities bilaterally including dorsiflexion and plantar flexion, strong and equal grip strength. Decreased sensation to light touch on right leg. Normal sensation to light touch on left leg. Moves extremities without ataxia, coordination intact. Normal gait and balance.  Skin: Skin is warm and dry. No rash, erythema, edema, or skin changes noted. Pt is not diaphoretic.  Psychiatric: Pt has a normal mood and affect. Behavior is normal.  Nursing note and vitals reviewed.    ED Treatments / Results  Labs (all labs ordered are listed, but only abnormal results are displayed) Labs Reviewed - No data to display  EKG None  Radiology No results found.  Procedures Procedures (including critical care time)  Medications Ordered in ED Medications  ketorolac (TORADOL) 30 MG/ML injection 30 mg (30 mg Intramuscular Given 07/04/18 1548)     Initial Impression / Assessment and Plan / ED Course  I have reviewed the triage vital signs and the nursing notes.  Pertinent labs & imaging results that were available during my care of the patient were reviewed by me and considered in my medical decision making (see chart for details).    Patient with back pain.  No neurological deficits and normal neuro exam.  Patient can walk but states is painful.  No loss of bowel or bladder control.  No concern for cauda equina.  No fever, night sweats, weight loss, h/o cancer, or recent IVDU. Provided Toradol for pain in the ER. RICE protocol, naproxen, and robaxin indicated  and discussed with patient.   Final Clinical Impressions(s) / ED Diagnoses   Final diagnoses:  Chronic right-sided low back pain with right-sided sciatica    ED Discharge Orders         Ordered    naproxen (NAPROSYN) 500 MG tablet  2 times daily     07/04/18 1553    methocarbamol (ROBAXIN) 500 MG tablet  2 times daily     07/04/18 1553           Carlyle BasquesHernandez, Brittni Hult Holly Lake RanchP, [place] JerseyPA-C 07/04/18 1555    Gwyneth SproutPlunkett, Whitney, MD 07/06/18 [phone]

## 2018-07-04 NOTE — ED Triage Notes (Signed)
Pt c/o acute on chronic back pain. Pain worse over the past week. Pain radiating down R leg. Denies incontinence. Pt also stating she feel a couple of weeks ago.

## 2018-07-04 NOTE — Discharge Instructions (Addendum)
Today you were evaluated for back pain.  1. Medications: robaxin, naproxen, usual home medications 2. Treatment: rest, drink plenty of fluids, gentle stretching as discussed, alternate ice and heat 3. Follow Up: Please follow up with your primary doctor in 3 days for discussion of your diagnoses and further evaluation after today's visit; if you do not have a primary care doctor use the resource guide provided to find one;  Return to the ER for worsening back pain, difficulty walking, loss of bowel or bladder control or other concerning symptoms. Follow up with orthopedics if not improving.  I have prescribed you an anti-inflammatory medication and a muscle relaxer.   Naproxen is a nonsteroidal anti-inflammatory medication that will help with pain and swelling. Be sure to take this medication as prescribed with food, 1 pill every 12 hours,  It should be taken with food, as it can cause stomach upset, and more seriously, stomach bleeding. Do not take other nonsteroidal anti-inflammatory medications with this such as Advil, Motrin, or Aleve.   Robaxin is the muscle relaxer I have prescribed, this is meant to help with muscle tightness. Be aware that this medication may make you drowsy therefore the first time you take this it should be at a time you are in an environment where you can rest. Do not drive or operate heavy machinery when taking this medication.   In addition you may also take Tylenol. Tylenol is generally safe, though you should not take more than 8 of the extra strength (500mg ) pills a day.  The application of heat can help soothe the pain.  Maintaining your daily activities, including walking, is encourged, as it will help you get better faster than just staying in bed.  Low back pain is discomfort in the lower back that may be due to injuries to muscles and ligaments around the spine.  Occasionally, it may be caused by a a problem to a part of the spine called a disc.  The pain may  last several days or a few weeks.   Your pain should get better over the next 2 weeks.  You will need to follow up with your primary healthcare provider in 1-2 weeks for reassessment, if you do not have a primary care provider one is provided in your discharge instructions. However if you develop severe or worsening pain, low back pain with fever, numbness, weakness, loss of bowel or bladder control, or inability to walk or urinate, you should return to the ER immediately.  Please follow up with your doctor this week for a recheck if still having symptoms.  Back Pain:  Your back pain should be treated with medicines such as ibuprofen or aleve and this back pain should get better over the next 2 weeks.  However if you develop severe or worsening pain, low back pain with fever, numbness, weakness or inability to walk or urinate, you should return to the ER immediately.  Please follow up with your doctor this week for a recheck if still having symptoms.  Low back pain is discomfort in the lower back that may be due to injuries to muscles and ligaments around the spine.  Occasionally, it may be caused by a a problem to a part of the spine called a disc.  The pain may last several days or a week;  However, most patients get completely well in 4 weeks.  Low back pain is very common. About 1 in 5 people have back pain. The cause of low back  pain is rarely dangerous. The pain often gets better over time. About half of people with a sudden onset of back pain feel better in just 2 weeks. About 8 in 10 people feel better by 6 weeks.   CAUSES Some common causes of back pain include: Strain of the muscles or ligaments supporting the spine.  Wear and tear (degeneration) of the spinal discs.  Arthritis.  Direct injury to the back.   DIAGNOSIS Most of the time, the direct cause of low back pain is not known. However, back pain can be treated effectively even when the exact cause of the pain is unknown. Answering  your caregiver's questions about your overall health and symptoms is one of the most accurate ways to make sure the cause of your pain is not dangerous. If your caregiver needs more information, he or she may order lab work or imaging tests (X-rays or MRIs). However, even if imaging tests show changes in your back, this usually does not require surgery.  HOME CARE INSTRUCTIONS For many people, back pain returns. Since low back pain is rarely dangerous, it is often a condition that people can learn to manage on their own.  Remain active. It is stressful on the back to sit or stand in one place. Do not sit, drive, or stand in one place for more than 30 minutes at a time. Take short walks on level surfaces as soon as pain allows. Try to increase the length of time you walk each day.  Do not stay in bed. Resting more than 1 or 2 days can delay your recovery.  Do not avoid exercise or work. Your body is made to move. It is not dangerous to be active, even though your back may hurt. Your back will likely heal faster if you return to being active before your pain is gone.  Pay attention to your body when you  bend and lift. Many people have less discomfort when lifting if they bend their knees, keep the load close to their bodies, and avoid twisting. Often, the most comfortable positions are those that put less stress on your recovering back.  Find a comfortable position to sleep. Use a firm mattress and lie on your side with your knees slightly bent. If you lie on your back, put a pillow under your knees.  Only take over-the-counter or prescription medicines as directed by your caregiver. Over-the-counter medicines to reduce pain and inflammation are often the most helpful. Your caregiver may prescribe muscle relaxant drugs. These medicines help dull your pain so you can more quickly return to your normal activities and healthy exercise.  Put ice on the injured area.  Put ice in a plastic bag.  Place a towel  between your skin and the bag.  Leave the ice on for 15 to 20 minutes, 3 to 4 times a day for the first 2 to 3 days. After that, ice and heat may be alternated to reduce pain and spasms.  Ask your caregiver about trying back exercises and gentle massage. This may be of some benefit.  Avoid feeling anxious or stressed. Stress increases muscle tension and can worsen back pain. It is important to recognize when you are anxious or stressed and learn ways to manage it. Exercise is a great option.   SEEK MEDICAL CARE IF: You have pain that is not relieved with rest or medicine.  You have pain that does not improve in 1 week.  You have new symptoms.  You  are generally not feeling well.  ° °SEEK IMMEDIATE MEDICAL CARE IF:  °You have pain that radiates from your back into your legs.  °You develop new bowel or bladder control problems.  °You have unusual weakness or numbness in your arms or legs.  °You develop nausea or vomiting.  °You develop abdominal pain.  °You feel faint.  ° °Be aware that if you develop new symptoms, such as a fever, leg weakness, difficulty with or loss of control of your urine or bowels, abdominal pain, or more severe pain, you will need to seek medical attention and  / or return to the Emergency department. ° °If you do not have a doctor see the list below. ° ° °  ° °

## 2021-12-14 ENCOUNTER — Encounter (HOSPITAL_BASED_OUTPATIENT_CLINIC_OR_DEPARTMENT_OTHER): Payer: Self-pay | Admitting: Emergency Medicine

## 2021-12-14 ENCOUNTER — Emergency Department (HOSPITAL_BASED_OUTPATIENT_CLINIC_OR_DEPARTMENT_OTHER): Payer: BC Managed Care – PPO

## 2021-12-14 ENCOUNTER — Other Ambulatory Visit: Payer: Self-pay

## 2021-12-14 ENCOUNTER — Emergency Department (HOSPITAL_BASED_OUTPATIENT_CLINIC_OR_DEPARTMENT_OTHER)
Admission: EM | Admit: 2021-12-14 | Discharge: 2021-12-14 | Disposition: A | Discharge status: Home / Self Care | Payer: BC Managed Care – PPO | Attending: Emergency Medicine | Admitting: Emergency Medicine

## 2021-12-14 DIAGNOSIS — M25552 Pain in left hip: Secondary | ICD-10-CM | POA: Diagnosis present

## 2021-12-14 MED ORDER — METHOCARBAMOL 500 MG PO TABS: 500.0000 mg | ORAL_TABLET | Freq: Two times a day (BID) | ORAL | 0 refills | Status: AC

## 2021-12-14 MED ORDER — METHOCARBAMOL 500 MG PO TABS
500.0000 mg | ORAL_TABLET | Freq: Once | ORAL | Status: AC
Start: 2021-12-14 — End: 2021-12-14
  Administered 2021-12-14: 500 mg via ORAL
  Filled 2021-12-14: qty 1

## 2021-12-14 NOTE — ED Triage Notes (Signed)
Pt c/o LT hip pain x 2 wks, worse since Friday

## 2021-12-14 NOTE — ED Notes (Signed)
Imaging tbd s/p preg test results. 

## 2021-12-14 NOTE — ED Provider Notes (Signed)
[facility] EMERGENCY DEPARTMENT Provider Note   CSN: [id number] Arrival date & time: 12/14/21  1712     History {Add pertinent medical, surgical, social history, OB history to HPI:1} Chief Complaint  Patient presents with   Hip Pain    Kylie Reynolds is a 46 y.o. female.  Patient is 46 year old female with past medical history of hip bursitis presenting for complaints of left-sided hip pain.  Patient states this feels different than her normal hip bursitis.  The pain is so severe that it is making it difficult to ambulate.  States the pain has been present for past 2 weeks.  Denies any falls or eliciting injuries.  Denies any back pain, sensation, or motor dysfunction.  The history is provided by the patient. No language interpreter was used.  Hip Pain      Home Medications Prior to Admission medications   Medication Sig Start Date End Date Taking? Authorizing Provider  methocarbamol (ROBAXIN) 500 MG tablet Take 1 tablet (500 mg total) by mouth 2 (two) times daily. 07/04/18   Carlyle Basques P, PA-C  naproxen (NAPROSYN) 500 MG tablet Take 1 tablet (500 mg total) by mouth 2 (two) times daily. 07/04/18   Leretha Dykes, PA-C      Allergies    Patient has no known allergies.    Review of Systems   Review of Systems  Constitutional:  Negative for chills and fever.  Musculoskeletal:        Left-sided hip pain  Neurological:  Negative for weakness and numbness.   Physical Exam Updated Vital Signs BP 125/67   Pulse 76   Temp 98.6 F (37 C) (Oral)   Resp 18   Ht 5\' 7"  (1.702 m)   Wt 86.2 kg   LMP 11/16/2021   SpO2 100%   BMI 29.76 kg/m  Physical Exam Vitals and nursing note reviewed.  Constitutional:      Appearance: Normal appearance.  Cardiovascular:     Rate and Rhythm: Normal rate.  Pulmonary:     Effort: Pulmonary effort is normal.  Musculoskeletal:        General: No swelling or deformity.     Right hip: Normal.     Left hip: Bony tenderness  present. No deformity or tenderness. Normal range of motion. Normal strength.     Right upper leg: Normal.     Left upper leg: Normal.  Skin:    General: Skin is warm.     Capillary Refill: Capillary refill takes less than 2 seconds.     Findings: No bruising, erythema or rash.  Neurological:     General: No focal deficit present.     Mental Status: She is alert.     GCS: GCS eye subscore is 4. GCS verbal subscore is 5. GCS motor subscore is 6.     Sensory: Sensation is intact.     Motor: Motor function is intact.    ED Results / Procedures / Treatments   Labs (all labs ordered are listed, but only abnormal results are displayed) Labs Reviewed  PREGNANCY, URINE    EKG None  Radiology No results found.  Procedures Procedures  {Document cardiac monitor, telemetry assessment procedure when appropriate:1}  Medications Ordered in ED Medications - No data to display  ED Course/ Medical Decision Making/ A&P                           Medical Decision Making Amount  and/or Complexity of Data Reviewed Radiology: ordered.   ***  {Document critical care time when appropriate:1} {Document review of labs and clinical decision tools ie heart score, Chads2Vasc2 etc:1}  {Document your independent review of radiology images, and any outside records:1} {Document your discussion with family members, caretakers, and with consultants:1} {Document social determinants of health affecting pt's care:1} {Document your decision making why or why not admission, treatments were needed:1} Final Clinical Impression(s) / ED Diagnoses Final diagnoses:  None    Rx / DC Orders ED Discharge Orders     None

## 2022-12-24 ENCOUNTER — Other Ambulatory Visit: Payer: Self-pay

## 2022-12-24 ENCOUNTER — Emergency Department (HOSPITAL_BASED_OUTPATIENT_CLINIC_OR_DEPARTMENT_OTHER)
Admission: EM | Admit: 2022-12-24 | Discharge: 2022-12-24 | Disposition: A | Discharge status: Home / Self Care | Payer: BC Managed Care – PPO | Attending: Emergency Medicine | Admitting: Emergency Medicine

## 2022-12-24 ENCOUNTER — Emergency Department (HOSPITAL_BASED_OUTPATIENT_CLINIC_OR_DEPARTMENT_OTHER): Payer: BC Managed Care – PPO

## 2022-12-24 ENCOUNTER — Encounter (HOSPITAL_BASED_OUTPATIENT_CLINIC_OR_DEPARTMENT_OTHER): Payer: Self-pay

## 2022-12-24 DIAGNOSIS — I10 Essential (primary) hypertension: Secondary | ICD-10-CM | POA: Diagnosis not present

## 2022-12-24 DIAGNOSIS — R0789 Other chest pain: Secondary | ICD-10-CM | POA: Diagnosis present

## 2022-12-24 DIAGNOSIS — R079 Chest pain, unspecified: Secondary | ICD-10-CM

## 2022-12-24 DIAGNOSIS — R002 Palpitations: Secondary | ICD-10-CM | POA: Diagnosis not present

## 2022-12-24 DIAGNOSIS — R0602 Shortness of breath: Secondary | ICD-10-CM | POA: Insufficient documentation

## 2022-12-24 LAB — URINALYSIS, ROUTINE W REFLEX MICROSCOPIC
Bilirubin Urine: NEGATIVE
Glucose, UA: NEGATIVE mg/dL
Hgb urine dipstick: NEGATIVE
Ketones, ur: NEGATIVE mg/dL
Nitrite: NEGATIVE
Protein, ur: NEGATIVE mg/dL
Specific Gravity, Urine: 1.01 (ref 1.005–1.030)
pH: 6.5 (ref 5.0–8.0)

## 2022-12-24 LAB — BASIC METABOLIC PANEL
Anion gap: 9 (ref 5–15)
BUN: 24 mg/dL — ABNORMAL HIGH (ref 6–20)
CO2: 26 mmol/L (ref 22–32)
Calcium: 9.1 mg/dL (ref 8.9–10.3)
Chloride: 99 mmol/L (ref 98–111)
Creatinine, Ser: 0.85 mg/dL (ref 0.44–1.00)
GFR, Estimated: >60 mL/min (ref >60)
Glucose, Bld: 95 mg/dL (ref 70–99)
Potassium: 3.5 mmol/L (ref 3.5–5.1)
Sodium: 134 mmol/L — ABNORMAL LOW (ref 135–145)

## 2022-12-24 LAB — CBC
HCT: 40.3 % (ref 36.0–46.0)
Hemoglobin: 13.6 g/dL (ref 12.0–15.0)
MCH: 30 pg (ref 26.0–34.0)
MCHC: 33.7 g/dL (ref 30.0–36.0)
MCV: 88.8 fL (ref 80.0–100.0)
Platelets: 206 10*3/uL (ref 150–400)
RBC: 4.54 MIL/uL (ref 3.87–5.11)
RDW: 11.8 % (ref 11.5–15.5)
WBC: 6.2 10*3/uL (ref 4.0–10.5)
nRBC: 0 % (ref 0.0–0.2)

## 2022-12-24 LAB — TROPONIN I (HIGH SENSITIVITY)
Troponin I (High Sensitivity): <2 ng/L (ref <18)
Troponin I (High Sensitivity): <2 ng/L (ref <18)

## 2022-12-24 LAB — URINALYSIS, MICROSCOPIC (REFLEX)

## 2022-12-24 NOTE — ED Triage Notes (Signed)
Pt reports to ED with complaints of chest pain and shortness of breath this morning. States episode lasted about .

## 2022-12-24 NOTE — ED Provider Notes (Signed)
EMERGENCY DEPARTMENT AT [facility] Provider Note   CSN: [id number] Arrival date & time: 12/24/22  0913     History  Chief Complaint  Patient presents with   Chest Pain    Kylie Reynolds is a 47 y.o. female with history of HTN who presents to the ER complaining of chest discomfort starting this AM. 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 Also feeling like her heart was pounding. She states having had similar symptoms in the past, but never this severe. Symptoms tend to happen when it's very hot outside or she is exerting herself, which she does often at work. No recent long travel, no leg pain or swelling. No recent illnesses or fevers. Not having much discomfort right now, but still some shortness of breath.    Chest Pain Associated symptoms: palpitations and shortness of breath   Associated symptoms: no cough and no fever        Home Medications Prior to Admission medications   Medication Sig Start Date End Date Taking? Authorizing Provider  methocarbamol (ROBAXIN) 500 MG tablet Take 1 tablet (500 mg total) by mouth 2 (two) times daily. 12/14/21   Edwin Dada P, DO  naproxen (NAPROSYN) 500 MG tablet Take 1 tablet (500 mg total) by mouth 2 (two) times daily. 07/04/18   Leretha Dykes, PA-C      Allergies    Patient has no known allergies.    Review of Systems   Review of Systems  Constitutional:  Negative for fever.  Respiratory:  Positive for shortness of breath. Negative for cough.   Cardiovascular:  Positive for chest pain and palpitations. Negative for leg swelling.  Neurological:  Positive for light-headedness. Negative for syncope.  All other systems reviewed and are negative.   Physical Exam Updated Vital Signs BP 135/86   Pulse 74   Temp 97.8 F (36.6 C)   Resp 16   Ht 5\' 7"  (1.702 m)    Wt 93 kg   SpO2 100%   BMI 32.11 kg/m  Physical Exam Vitals and nursing note reviewed.  Constitutional:      Appearance: Normal appearance.  HENT:     Head: Normocephalic and atraumatic.  Eyes:     Conjunctiva/sclera: Conjunctivae normal.  Cardiovascular:     Rate and Rhythm: Normal rate and regular rhythm.  Pulmonary:     Effort: Pulmonary effort is normal. No respiratory distress.     Breath sounds: Normal breath sounds.  Abdominal:     General: There is no distension.     Palpations: Abdomen is soft.     Tenderness: There is no abdominal tenderness.  Musculoskeletal:     Right lower leg: No edema.     Left lower leg: No edema.  Skin:    General: Skin is warm and dry.  Neurological:     General: No focal deficit present.     Mental Status: She is alert.     ED Results / Procedures / Treatments   Labs (all labs ordered are listed, but only abnormal results are displayed) Labs Reviewed  BASIC METABOLIC PANEL - Abnormal; Notable for the following components:      Result Value   Sodium 134 (*)    BUN 24 (*)    All other components within normal limits  URINALYSIS, ROUTINE W REFLEX MICROSCOPIC - Abnormal;  Notable for the following components:   Leukocytes,Ua TRACE (*)    All other components within normal limits  URINALYSIS, MICROSCOPIC (REFLEX) - Abnormal; Notable for the following components:   Bacteria, UA FEW (*)    All other components within normal limits  CBC  TROPONIN I (HIGH SENSITIVITY)  TROPONIN I (HIGH SENSITIVITY)    EKG EKG Interpretation  Date/Time:  Thursday December 24 2022 09:23:45 EDT Ventricular Rate:  71 PR Interval:  197 QRS Duration: 84 QT Interval:  386 QTC Calculation: 420 R Axis:   70 Text Interpretation: Sinus rhythm Confirmed by Vivi Barrack [phone]) on 12/24/2022 9:44:00 AM  Radiology DG Chest 2 View  Result Date: 12/24/2022 CLINICAL DATA:  47 year old female with chest pain and shortness of breath. EXAM: CHEST - 2 VIEW  COMPARISON:  Chest radiographs 08/23/2012. FINDINGS: PA and lateral views at 0958 hours. Lower lung volumes. Mediastinal contours remain normal. Visualized tracheal air column is within normal limits. Lungs remain clear. No pneumothorax or pleural effusion. No acute osseous abnormality identified. Subtle chronic scoliosis. Negative visible bowel gas. IMPRESSION: No acute cardiopulmonary abnormality. Electronically Signed   By: Odessa Fleming M.D.   On: 12/24/2022 10:06    Procedures Procedures    Medications Ordered in ED Medications - No data to display  ED Course/ Medical Decision Making/ A&P             HEART Score: 3                Medical Decision Making Amount and/or Complexity of Data Reviewed Labs: ordered. Radiology: ordered.   This patient is a 47 y.o. female  who presents to the ED for concern of chest pain and shortness of breath.   Differential diagnoses prior to evaluation: The emergent differential diagnosis includes, but is not limited to,  Cardiac arrhythmias, ACS, CHF, pericarditis, valvular disease, panic/anxiety, ETOH, stimulant use, medication side effect, anemia, hyperthyroidism, pulmonary embolism. This is not an exhaustive differential.   Past Medical History / Co-morbidities / Social History: HTN, hepatitis C  Physical Exam: Physical exam performed. The pertinent findings include: Vital signs, no acute distress.  Heart regular rate and rhythm, lung sounds clear, normal respiratory effort.  No peripheral edema.  Lab Tests/Imaging studies: I personally interpreted labs/imaging and the pertinent results include: CBC and BMP unremarkable.  Urinalysis negative for hematuria or infection.  Troponins flat at less than 2.  Chest x-ray without acute abnormalities. I agree with the radiologist interpretation.  Cardiac monitoring: EKG obtained and interpreted by my attending physician which shows: Sinus rhythm with rate of 71 bpm   Disposition: After consideration of  the diagnostic results and the patients response to treatment, I feel that emergency department workup does not suggest an emergent condition requiring admission or immediate intervention beyond what has been performed at this time. Patient is to be discharged with recommendation to follow up with PCP in regards to today's hospital visit. Chest pain is not likely of cardiac or pulmonary etiology d/t presentation, PERC criteria for PE negative, VSS, no tracheal deviation, no JVD or new murmur, RRR, breath sounds equal bilaterally, EKG without acute abnormalities, negative troponin, and negative CXR. Heart score of 3. Pt has been advised to return to the ED if CP becomes exertional, associated with diaphoresis or nausea, radiates to left jaw/arm, worsens or becomes concerning in any way. Believe she would benefit from follow up with cardiology and possible holter monitor. Pt appears reliable for follow up and is agreeable to discharge.  Final Clinical Impression(s) / ED Diagnoses Final diagnoses:  Nonspecific chest pain  Palpitations    Rx / DC Orders ED Discharge Orders          Ordered    Ambulatory referral to Cardiology       Comments: If you have not heard from the Cardiology office within the next 72 hours please call [phone].   12/24/22 1259           Portions of this report may have been transcribed using voice recognition software. Every effort was made to ensure accuracy; however, inadvertent computerized transcription errors may be present.    Su Monks, PA-C 12/24/22 1301    Loetta Rough, MD 12/24/22 1512

## 2022-12-24 NOTE — Discharge Instructions (Addendum)
You were seen in the emergency department today for chest pain.  As we discussed your lab work, EKG, chest x-ray all looked reassuring today.  I think that your symptoms could likely been related to dehydration, stress, or an occasional irregular heart rhythm.   I recommend monitoring your stress levels.  Continue to monitor how you are doing overall, and return to the emergency department for any new or worsening symptoms such as: Worsening pain or pain with exertion, difficulty breathing, sweating, or pain or swelling in your legs.  I've sent a referral to the cardiologist's office as well.

## 2023-02-08 ENCOUNTER — Ambulatory Visit: Payer: BC Managed Care – PPO | Attending: Internal Medicine | Admitting: Internal Medicine

## 2023-02-08 ENCOUNTER — Encounter: Payer: Self-pay | Admitting: Internal Medicine

## 2023-02-08 VITALS — BP 128/78 | HR 100 | Ht 67.0 in | Wt 217.2 lb

## 2023-02-08 DIAGNOSIS — R002 Palpitations: Secondary | ICD-10-CM | POA: Diagnosis not present

## 2023-02-08 NOTE — Patient Instructions (Signed)
Medication Instructions:  Your physician recommends that you continue on your current medications as directed. Please refer to the Current Medication list given to you today.  *If you need a refill on your cardiac medications before your next appointment, please call your pharmacy*     Follow-Up: At [facility], you and your health needs are our priority.  As part of our continuing mission to provide you with exceptional heart care, we have created designated Provider Care Teams.  These Care Teams include your primary Cardiologist (physician) and Advanced Practice Providers (APPs -  Physician Assistants and Nurse Practitioners) who all work together to provide you with the care you need, when you need it.  Your next appointment:   F/u as needed   Provider:   Maisie Fus, MD

## 2023-02-08 NOTE — Progress Notes (Signed)
Cardiology Office Note:    Date:  02/08/2023   ID:  Kylie Reynolds, DOB Apr 23, 1976, MRN [id number]  PCP:  Default, Provider, MD   [facility] Providers Cardiologist:  None     Referring MD: Su Monks, PA-C   No chief complaint on file. Dizziness/Palpitations  History of Present Illness:    Kylie Reynolds is a 47 y.o. female with a hx of HTN, referral from the ED for CP and palpitations. 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 Also feeling like her heart was pounding. She states having had similar symptoms in the past, but never this severe. Symptoms tend to happen when it's very hot outside or she is exerting herself, which she does often at work. " Her EKG showed sinus rhythm with no ischemic changes  The 10-year ASCVD risk score (Arnett DK, et al., 2019) is: 0.7%   Values used to calculate the score:     Age: 89 years     Sex: Female     Is Non-Hispanic African American: No     Diabetic: No     Tobacco smoker: No     Systolic Blood Pressure: 134 mmHg     Is BP treated: Yes     HDL Cholesterol: 95 MG/DL     Total Cholesterol: 216 MG/DL   Past Medical History:  Diagnosis Date   Hepatitis C    Hypertension     Past Surgical History:  Procedure Laterality Date   ABDOMINAL SURGERY     APPENDECTOMY      Current Medications: No outpatient medications have been marked as taking for the 02/08/23 encounter (Appointment) with Maisie Fus, MD.     Allergies:   Patient has no known allergies.   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 : Never true  Transportation Needs: No Transportation Needs (12/27/2022)   [facility]   Transportation    In the past 12 months, has lack of reliable transportation kept you from medical appointments, meetings, work or from getting  things needed for daily living? : No  Physical Activity: Not on file  Stress: Not on file  Social Connections: Not on file     Family History: No premature CAD  ROS:   Please see the history of present illness.     All other systems reviewed and are negative.  EKGs/Labs/Other Studies Reviewed:    The following studies were reviewed today:  Recent Labs: 12/24/2022: BUN 24; Creatinine, Ser 0.85; Hemoglobin 13.6; Platelets 206; Potassium 3.5; Sodium 134  Recent Lipid Panel No results found for: "CHOL", "TRIG", "HDL", "CHOLHDL", "VLDL", "LDLCALC", "LDLDIRECT"   Risk Assessment/Calculations:     Physical Exam:    VS:   Vitals:   02/08/23 1519  BP: 128/78  Pulse: 100  SpO2: 95%     Wt Readings from Last 3 Encounters:  12/24/22 205 lb (93 kg)  12/14/21 190 lb (86.2 kg)  07/04/18 200 lb (90.7 kg)     GEN:  Well nourished, well  developed in no acute distress HEENT: Normal NECK: No JVD; No carotid bruits LYMPHATICS: No lymphadenopathy CARDIAC: RRR, no murmurs, rubs, gallops RESPIRATORY:  Clear to auscultation without rales, wheezing or rhonchi  ABDOMEN: Soft, non-tender, non-distended MUSCULOSKELETAL:  No edema; No deformity  SKIN: Warm and dry NEUROLOGIC:  Alert and oriented x 3 PSYCHIATRIC:  Normal affect   ASSESSMENT:    Palpitations Dizziness - I don't have the monitor results - She does not have high risk features including syncope c/f arrhythmia , family hx of SCD, or abnormalities on her EKGs.   We discussed anxiety can contribute  PLAN:    In order of problems listed above:  Continue BB Follow up PRN       Medication Adjustments/Labs and Tests Ordered: Current medicines are reviewed at length with the patient today.  Concerns regarding medicines are outlined above.  No orders of the defined types were placed in this encounter.  No orders of the defined types were placed in this encounter.   There are no Patient Instructions on file for this visit.   Signed, Maisie Fus, MD  02/08/2023 7:59 AM    [facility]

## 2023-02-19 IMAGING — CT CT HIP*L* W/O CM
2 of 3 series · 17 of 46 positions shown, 19 images · non-contrast
Comparison: Radiograph earlier today

CLINICAL DATA: Hip pain, chronic, labral tear suspected, xray done

EXAM:
CT OF THE LEFT HIP WITHOUT CONTRAST
TECHNIQUE: Multidetector CT imaging of the left hip was performed according to
the standard protocol. Multiplanar CT image reconstructions were
also generated.
RADIATION DOSE REDUCTION: This exam was performed according to the
departmental dose-optimization program which includes automated
exposure control, adjustment of the mA and/or kV according to
patient size and/or use of iterative reconstruction technique.

[Series 5: axial soft tissue · axial · 0.38mm/px · z∈[-370,-208]mm · 14 of 93 slices shown, 16 images]
[im 6/93  soft-tissue]
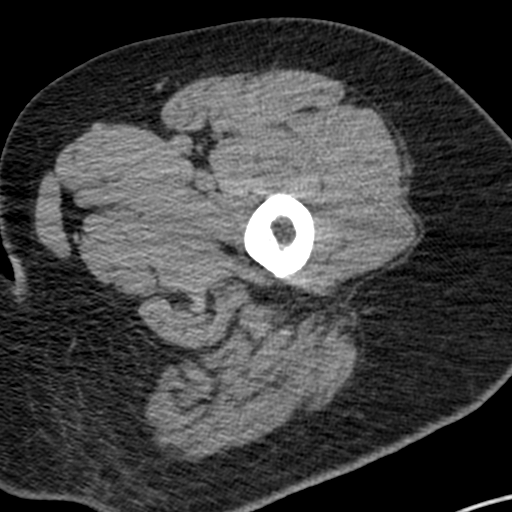
[im 6/93  bone]
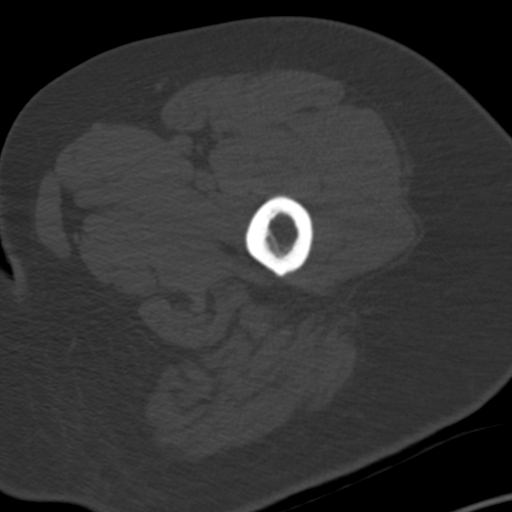
[im 12/93  soft-tissue]
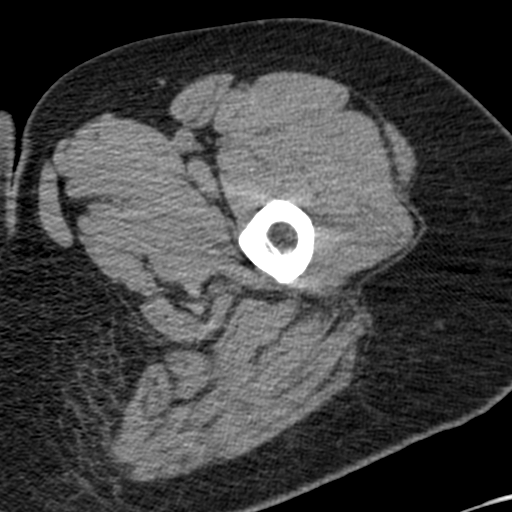
[im 18/93  soft-tissue]
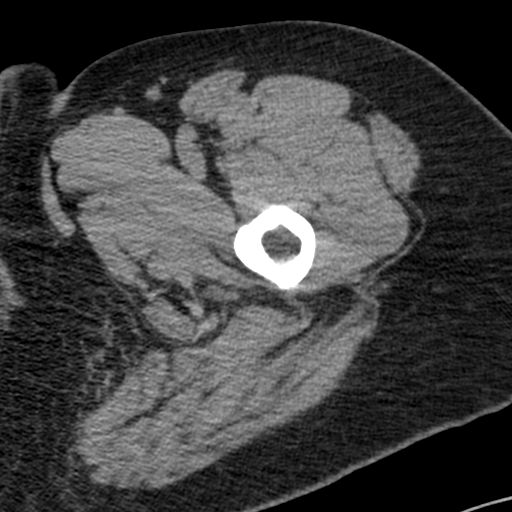
[im 24/93  soft-tissue]
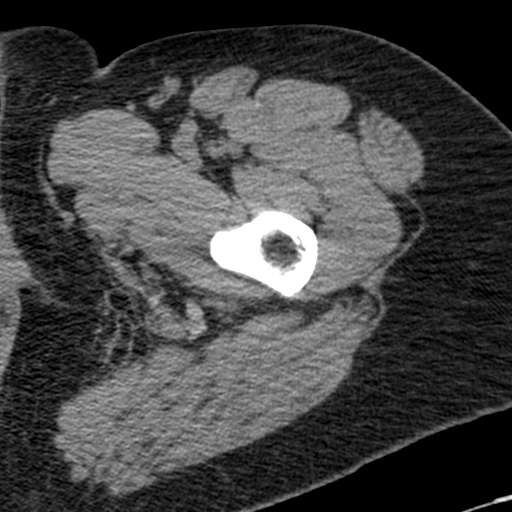
[im 30/93  soft-tissue]
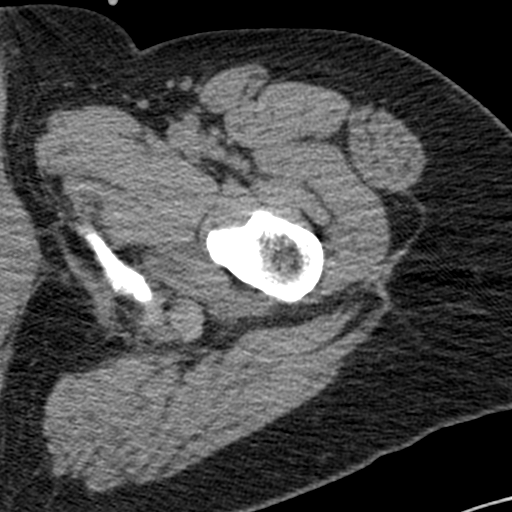
[im 36/93  soft-tissue]
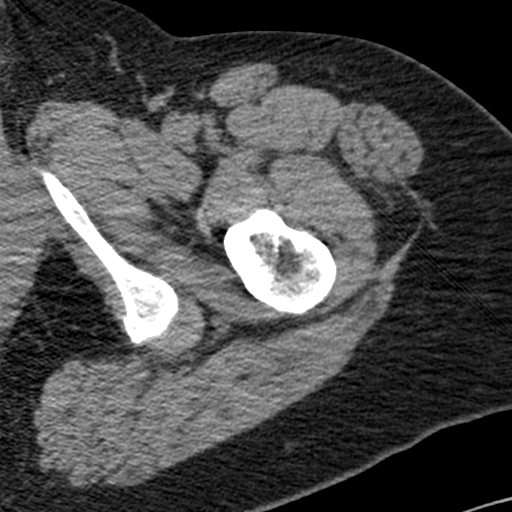
[im 42/93  soft-tissue]
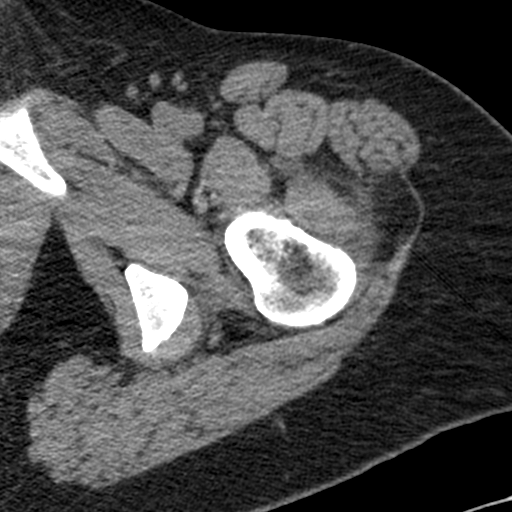
[im 51/93  soft-tissue]
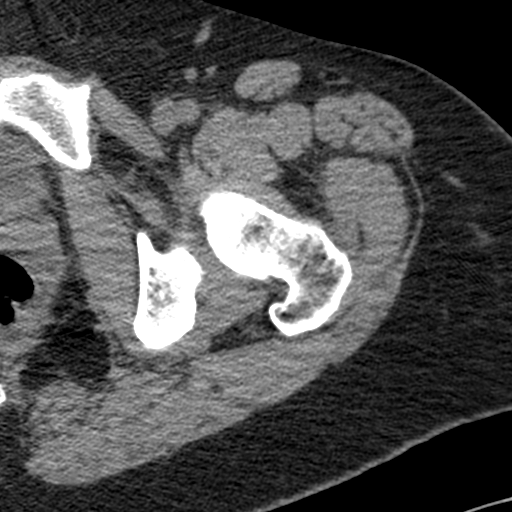
[im 57/93  soft-tissue]
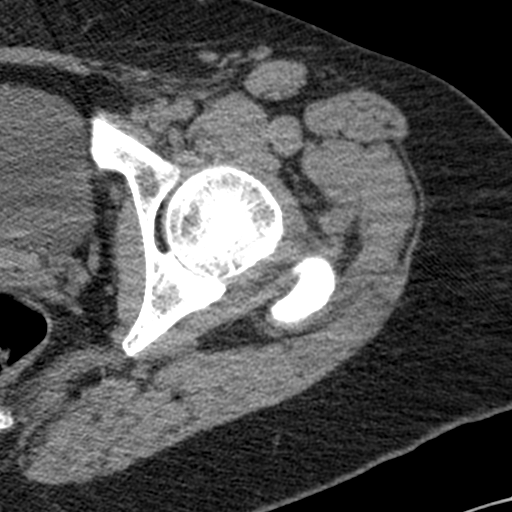
[im 57/93  bone]
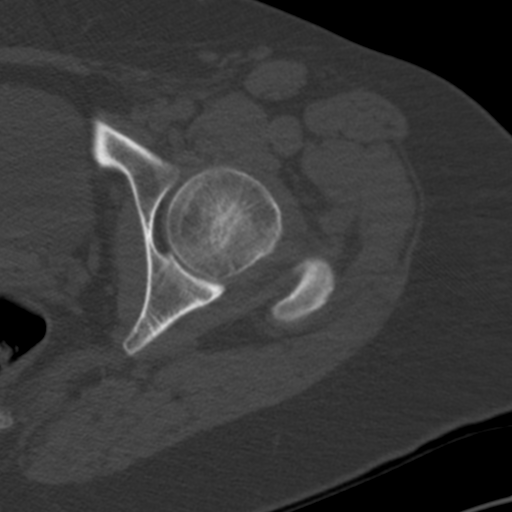
[im 63/93  soft-tissue]
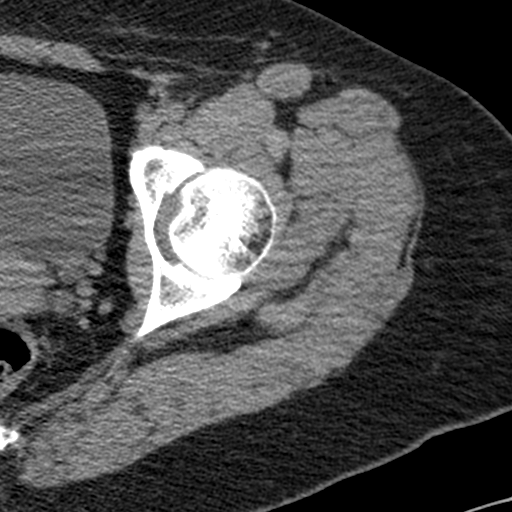
[im 69/93  soft-tissue]
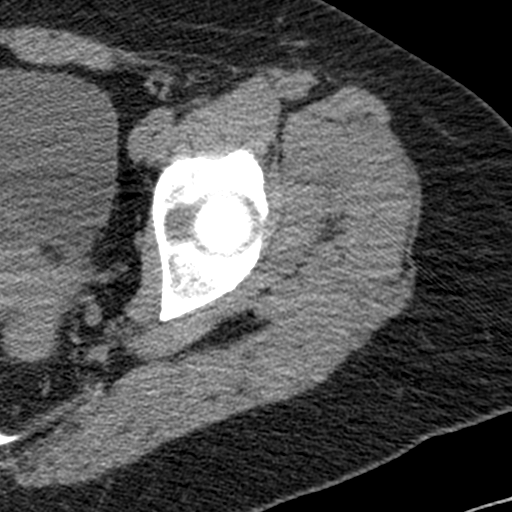
[im 75/93  soft-tissue]
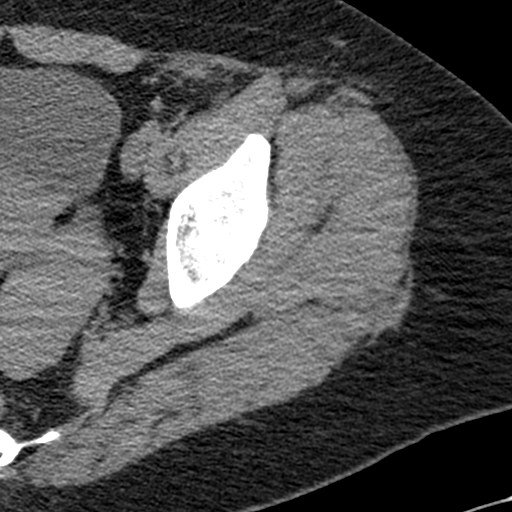
[im 81/93  soft-tissue]
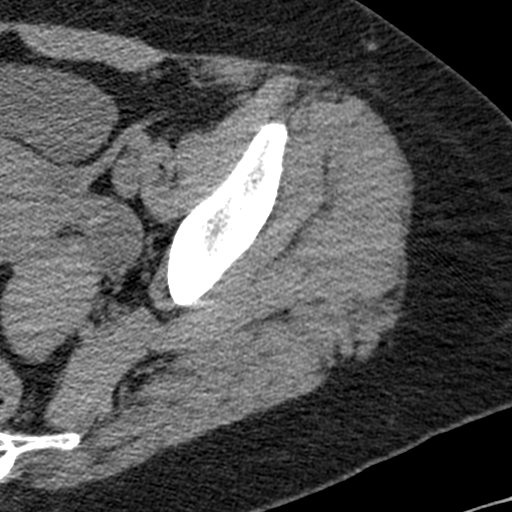
[im 87/93  soft-tissue]
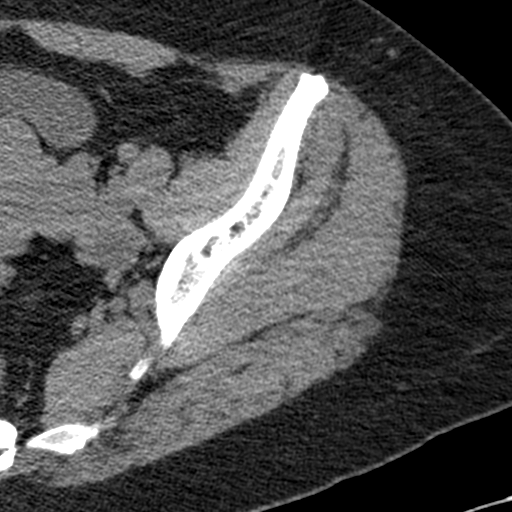

[Series 8: coronal st · coronal · 0.38mm/px · 3 of 117 slices shown]
[im 39/117  soft-tissue]
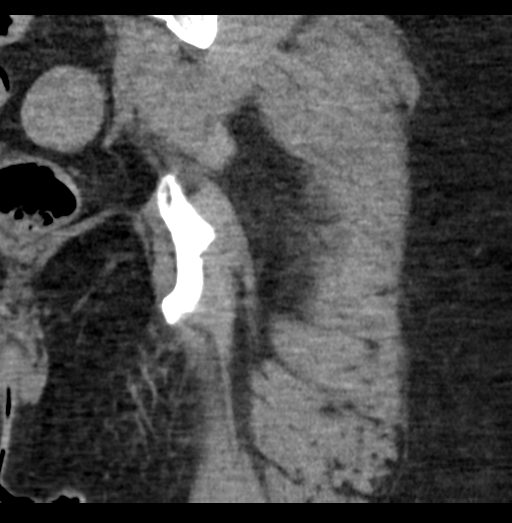
[im 52/117  soft-tissue]
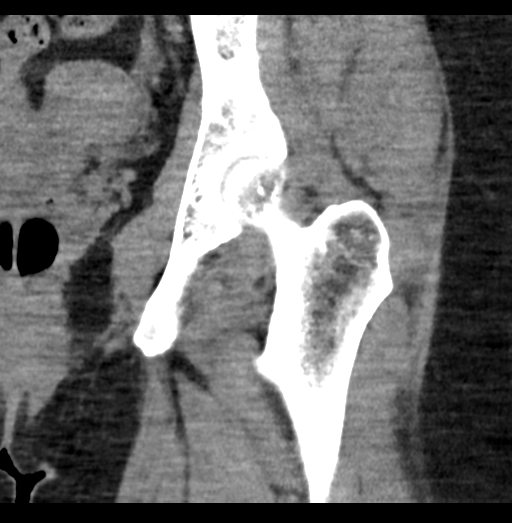
[im 65/117  soft-tissue]
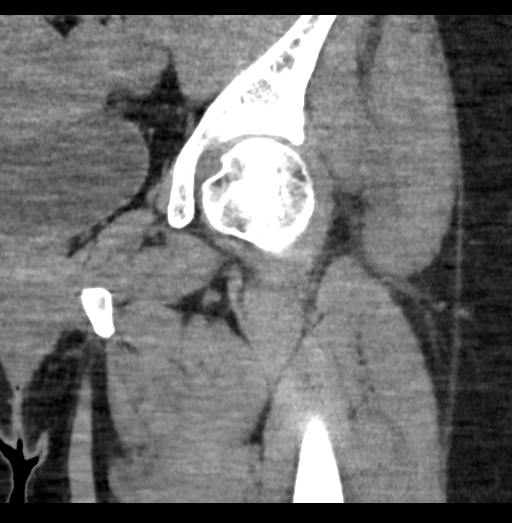

[17 of 46 positions shown; findings below may reference images not displayed]

FINDINGS: Bones/Joint/Cartilage

The hip joint spaces preserved. No fracture. No erosion or avascular
necrosis. No focal bone lesion or bone destruction. Intact pubic
rami. No hip joint effusion.

Ligaments

Suboptimally assessed by CT.

Muscles and Tendons

Unremarkable appearance of periarticular musculature on this
unenhanced CT. No calcified enthesopathy.

Soft tissues

There is an ovoid 5.2 cm soft tissue structure in the left pelvis
that is indeterminate in etiology, and only partially included in
the field of view.
IMPRESSION: 1. Unremarkable CT of the left hip. If there is clinical concern for
labral tear, recommend elective MR arthrogram on an outpatient
basis.
2. Ovoid 5.2 cm soft tissue structure in the left hemipelvis that is
indeterminate in etiology, and only partially included in the field
of view. This may represent an exophytic uterine fibroid, however
cannot be characterized on the current exam. Recommend pelvic
ultrasound on an elective basis.
